# Patient Record
Sex: Male | Born: 1985 | Race: Black or African American | Hispanic: No | Marital: Married | Smoking: Never smoker
Health system: Southern US, Community
[De-identification: ages and names within clinical notes are randomized; demographics above are authoritative.]

## PROBLEM LIST (undated history)

## (undated) DIAGNOSIS — E669 Obesity, unspecified: Secondary | ICD-10-CM

## (undated) DIAGNOSIS — I10 Essential (primary) hypertension: Secondary | ICD-10-CM

---

## 2007-08-17 ENCOUNTER — Encounter: Admission: RE | Admit: 2007-08-17 | Discharge: 2007-08-17 | Payer: Self-pay | Admitting: Family Medicine

## 2008-08-01 ENCOUNTER — Emergency Department (HOSPITAL_COMMUNITY): Admission: EM | Admit: 2008-08-01 | Discharge: 2008-08-01 | Payer: Self-pay | Admitting: Family Medicine

## 2008-10-23 IMAGING — CR DG MANDIBLE 1-3V
4 series · 4 of 4 positions shown · non-contrast
Comparison: none

CLINICAL DATA: Football injury.  Left jaw pain.  Decreased range of motion TM joint.
 DIAGNOSTIC MANDIBLE - 4 VIEW:

[view not recorded (1 of 4)]
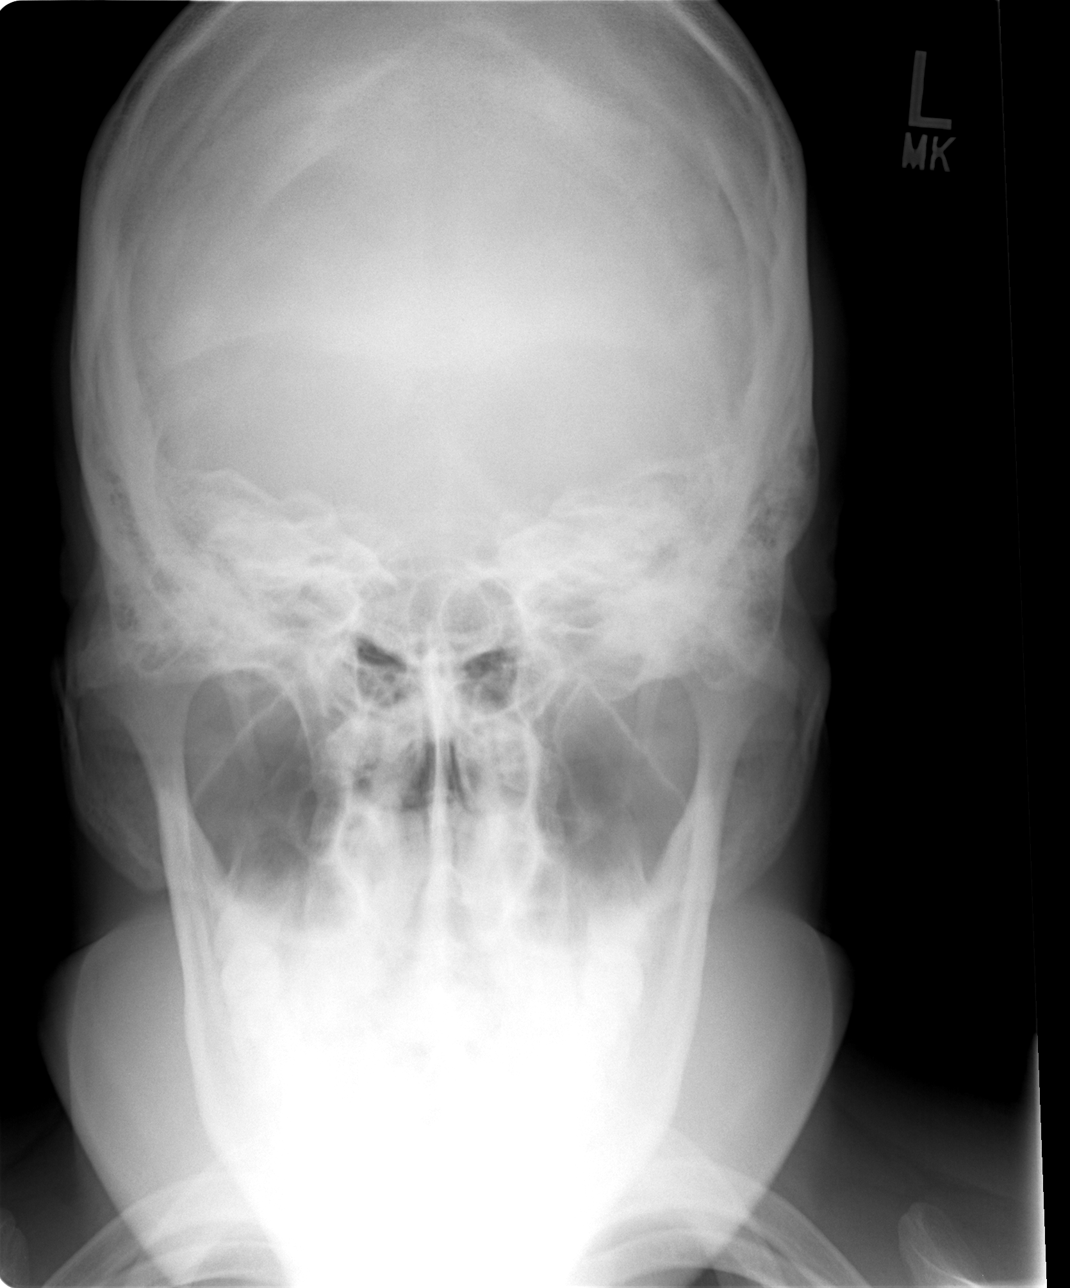

[view not recorded (2 of 4)]
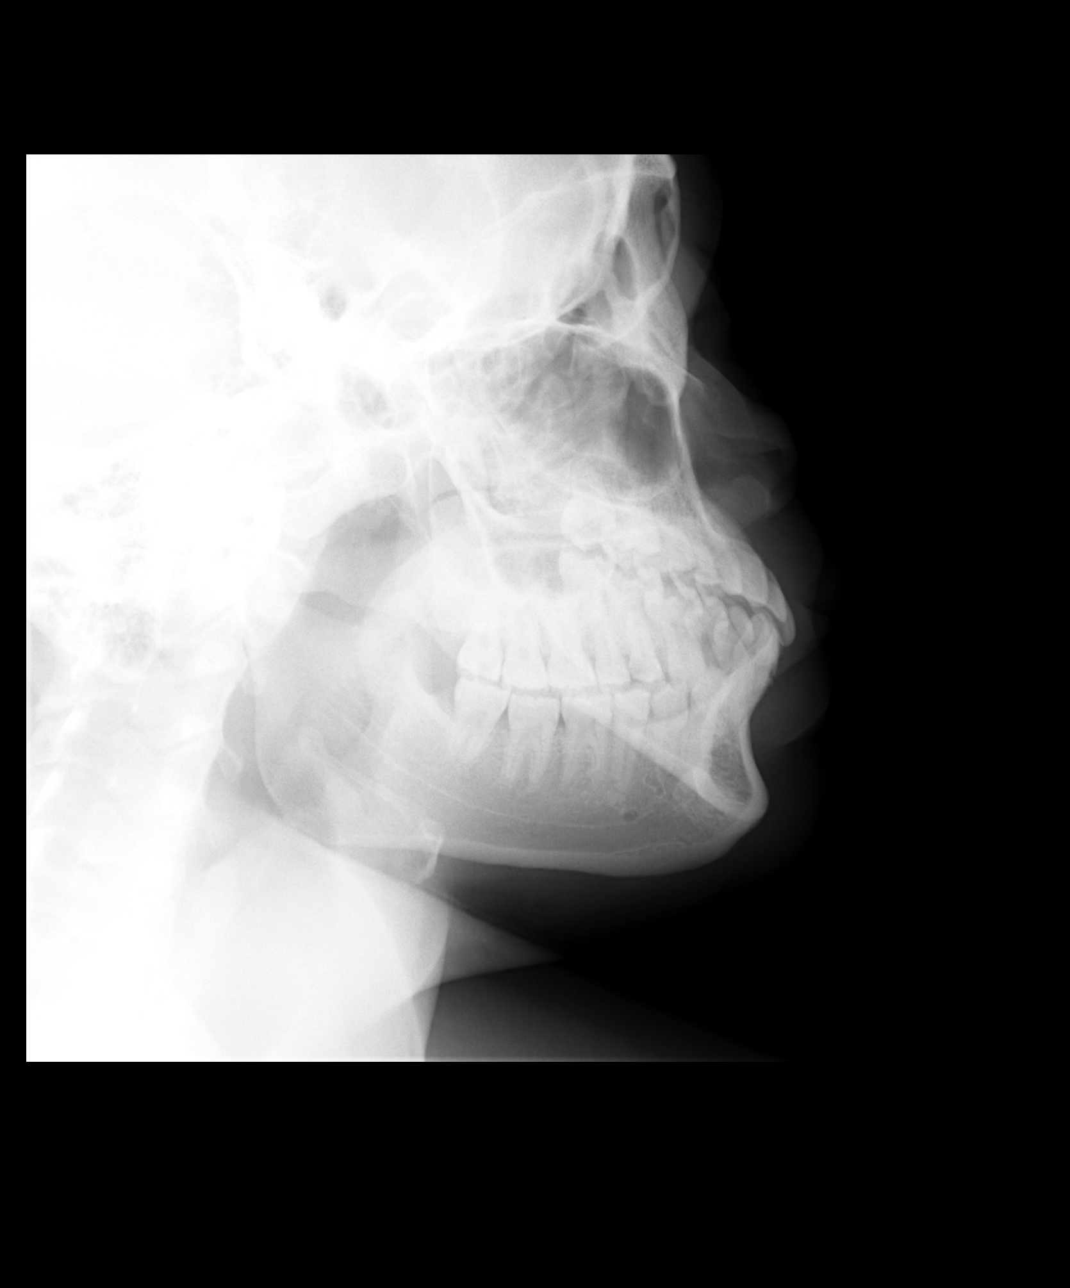

[view not recorded (3 of 4)]
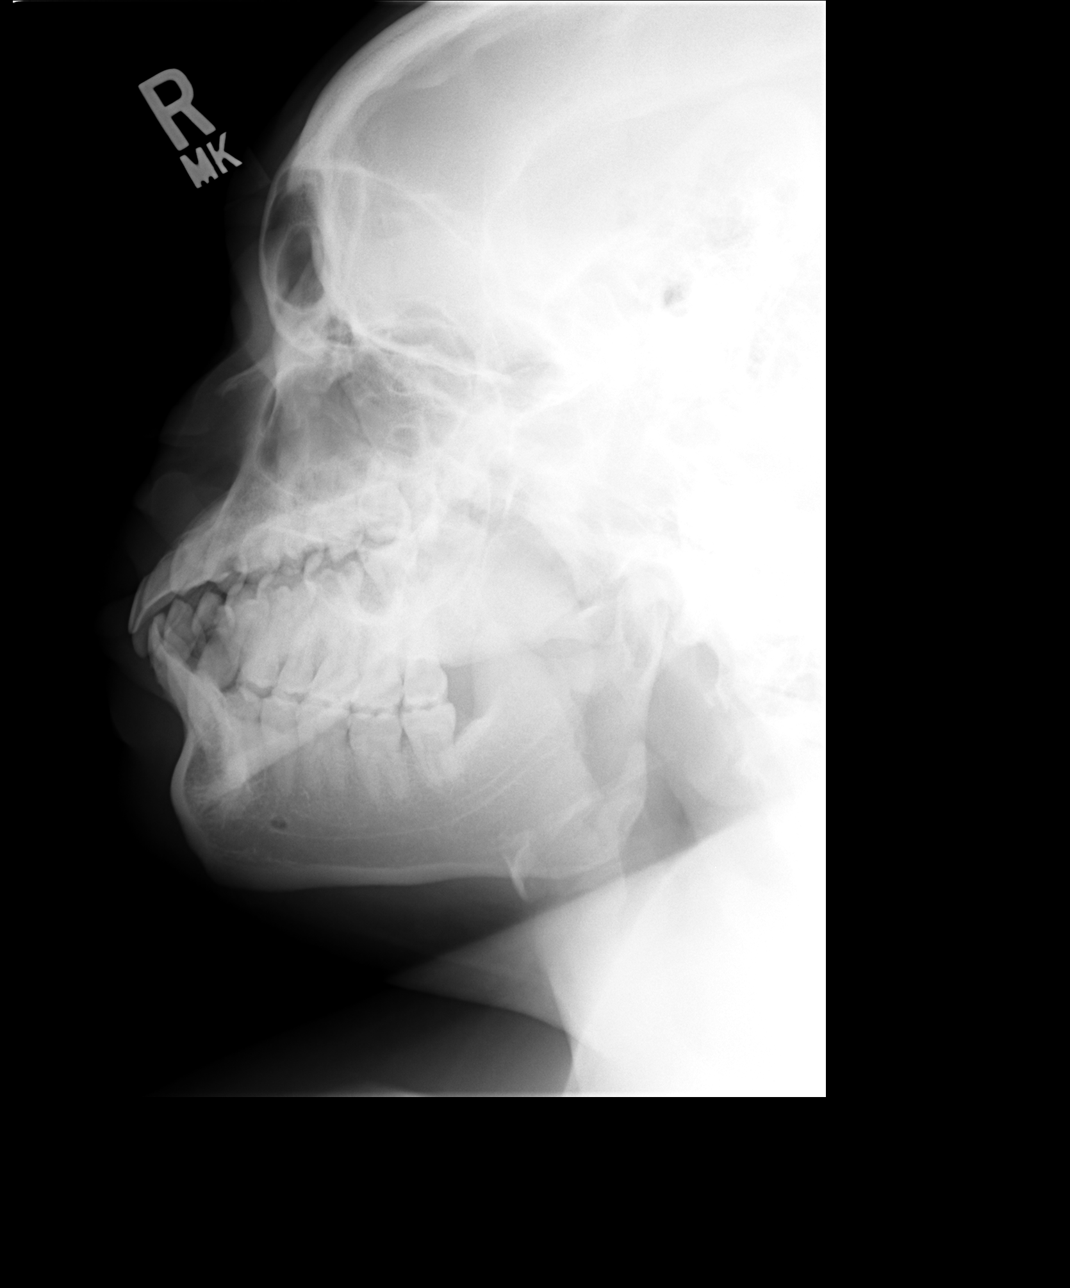

[view not recorded (4 of 4)]
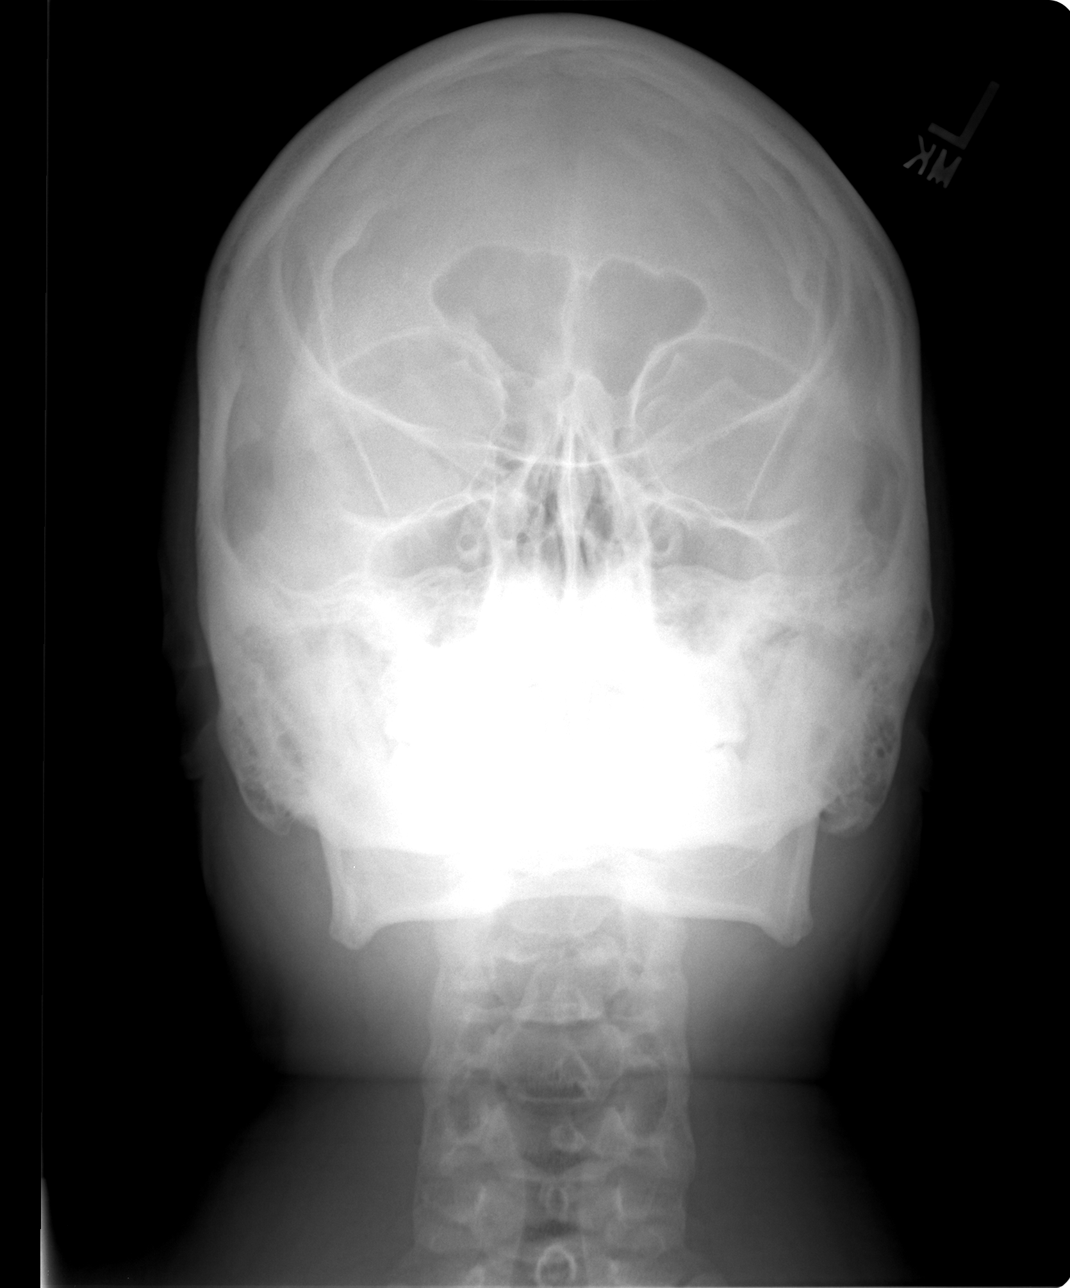

[4 of 4 positions shown; findings below may reference images not displayed]

FINDINGS: No fracture is seen.  Bilateral temporomandibular joints appear normally located.  Visualized paranasal sinuses and bilateral mastoid air cells are clear.
IMPRESSION: Negative.

## 2011-08-18 LAB — CBC
MCHC: 32.8
MCV: 79
RDW: 13.9

## 2011-08-18 LAB — URINALYSIS, ROUTINE W REFLEX MICROSCOPIC
Bilirubin Urine: NEGATIVE
Hgb urine dipstick: NEGATIVE
Nitrite: NEGATIVE
Protein, ur: NEGATIVE
Specific Gravity, Urine: 1.023
Urobilinogen, UA: 0.2

## 2011-08-18 LAB — DIFFERENTIAL
Basophils Absolute: 0
Basophils Relative: 0
Eosinophils Absolute: 0.1
Monocytes Absolute: 0.4
Neutro Abs: 7.9 — ABNORMAL HIGH
Neutrophils Relative %: 92 — ABNORMAL HIGH

## 2011-08-18 LAB — URINE MICROSCOPIC-ADD ON

## 2013-02-26 ENCOUNTER — Encounter (HOSPITAL_COMMUNITY): Payer: Self-pay | Admitting: *Deleted

## 2013-02-26 ENCOUNTER — Emergency Department (HOSPITAL_COMMUNITY)
Admission: EM | Admit: 2013-02-26 | Discharge: 2013-02-26 | Disposition: A | Discharge status: Home / Self Care | Payer: Self-pay | Attending: Emergency Medicine | Admitting: Emergency Medicine

## 2013-02-26 DIAGNOSIS — K5289 Other specified noninfective gastroenteritis and colitis: Secondary | ICD-10-CM | POA: Insufficient documentation

## 2013-02-26 DIAGNOSIS — K529 Noninfective gastroenteritis and colitis, unspecified: Secondary | ICD-10-CM

## 2013-02-26 DIAGNOSIS — R42 Dizziness and giddiness: Secondary | ICD-10-CM | POA: Insufficient documentation

## 2013-02-26 DIAGNOSIS — R112 Nausea with vomiting, unspecified: Secondary | ICD-10-CM | POA: Insufficient documentation

## 2013-02-26 DIAGNOSIS — E669 Obesity, unspecified: Secondary | ICD-10-CM | POA: Insufficient documentation

## 2013-02-26 DIAGNOSIS — R197 Diarrhea, unspecified: Secondary | ICD-10-CM | POA: Insufficient documentation

## 2013-02-26 HISTORY — DX: Obesity, unspecified: E66.9

## 2013-02-26 LAB — URINALYSIS, ROUTINE W REFLEX MICROSCOPIC
Glucose, UA: NEGATIVE mg/dL
Hgb urine dipstick: NEGATIVE
Protein, ur: NEGATIVE mg/dL
Specific Gravity, Urine: 1.023 (ref 1.005–1.030)
pH: 8.5 — ABNORMAL HIGH (ref 5.0–8.0)

## 2013-02-26 LAB — BASIC METABOLIC PANEL
CO2: 29 mEq/L (ref 19–32)
Calcium: 9.9 mg/dL (ref 8.4–10.5)
Chloride: 103 mEq/L (ref 96–112)
Creatinine, Ser: 0.97 mg/dL (ref 0.50–1.35)
Glucose, Bld: 118 mg/dL — ABNORMAL HIGH (ref 70–99)

## 2013-02-26 LAB — CBC WITH DIFFERENTIAL/PLATELET
Basophils Absolute: 0 10*3/uL (ref 0.0–0.1)
Eosinophils Relative: 0 % (ref 0–5)
HCT: 42.1 % (ref 39.0–52.0)
Hemoglobin: 14.8 g/dL (ref 13.0–17.0)
Lymphocytes Relative: 3 % — ABNORMAL LOW (ref 12–46)
Lymphs Abs: 0.3 10*3/uL — ABNORMAL LOW (ref 0.7–4.0)
MCV: 73.5 fL — ABNORMAL LOW (ref 78.0–100.0)
Monocytes Absolute: 0.4 10*3/uL (ref 0.1–1.0)
Monocytes Relative: 4 % (ref 3–12)
Neutro Abs: 9.2 10*3/uL — ABNORMAL HIGH (ref 1.7–7.7)
RBC: 5.73 MIL/uL (ref 4.22–5.81)
WBC: 9.9 10*3/uL (ref 4.0–10.5)

## 2013-02-26 LAB — LIPASE, BLOOD: Lipase: 17 U/L (ref 11–59)

## 2013-02-26 MED ORDER — PROMETHAZINE HCL 25 MG RE SUPP: 25.0000 mg | Freq: Four times a day (QID) | RECTAL | Status: DC | PRN

## 2013-02-26 MED ORDER — ACETAMINOPHEN 325 MG PO TABS
650.0000 mg | ORAL_TABLET | Freq: Once | ORAL | Status: AC
  Administered 2013-02-26: 650 mg via ORAL
  Filled 2013-02-26: qty 2

## 2013-02-26 MED ORDER — ACETAMINOPHEN 325 MG PO TABS: 650.0000 mg | ORAL_TABLET | Freq: Four times a day (QID) | ORAL | Status: DC | PRN

## 2013-02-26 MED ORDER — ONDANSETRON HCL 4 MG/2ML IJ SOLN
4.0000 mg | Freq: Once | INTRAMUSCULAR | Status: AC
  Administered 2013-02-26: 4 mg via INTRAVENOUS
  Filled 2013-02-26: qty 2

## 2013-02-26 MED ORDER — MORPHINE SULFATE 4 MG/ML IJ SOLN
4.0000 mg | Freq: Once | INTRAMUSCULAR | Status: AC
  Administered 2013-02-26: 4 mg via INTRAVENOUS
  Filled 2013-02-26: qty 1

## 2013-02-26 MED ORDER — ONDANSETRON 8 MG PO TBDP: 8.0000 mg | ORAL_TABLET | Freq: Three times a day (TID) | ORAL | Status: DC | PRN

## 2013-02-26 MED ORDER — SODIUM CHLORIDE 0.9 % IV BOLUS (SEPSIS)
1000.0000 mL | Freq: Once | INTRAVENOUS | Status: AC
  Administered 2013-02-26: 1000 mL via INTRAVENOUS

## 2013-02-26 NOTE — ED Notes (Signed)
Pt presents with left flank pain that began around 10am today, states pain has progressively gotten worse throughout the day.  Pt admits to N/V/D, last BM today.  Pt unsure when the last time he urinated.  Abdomen tender to palpation, bowel sounds present in all quadrants.

## 2013-02-26 NOTE — ED Notes (Signed)
Reports abd pain that started today 1000, having n/v and last bm was pta.

## 2013-02-26 NOTE — ED Provider Notes (Signed)
History     CSN: [id number]  Arrival date & time 02/26/13  1731   First MD Initiated Contact with Patient 02/26/13 1737      Chief Complaint  Patient presents with  . Abdominal Pain    (Consider location/radiation/quality/duration/timing/severity/associated sxs/prior treatment) HPI Comments: Pt w/ no medical or surgical hx comes in with cc of abd pain. Pt started having abd pain when he woke up this am. The pain is in the epigastrium and left sided and is intermittent pain. The pais is dull, and associated with nausea, emesis, diarrhea. 7-10 episodes of emesis and diarrhea. Non bloody, non bilious. Pt is feeling slightly dizzy and weak. Pt had some chinese food at 3 am.  Patient is a 27 y.o. male presenting with abdominal pain. The history is provided by the patient.  Abdominal Pain Associated symptoms: diarrhea, nausea and vomiting   Associated symptoms: no chest pain, no cough, no dysuria and no shortness of breath     Past Medical History  Diagnosis Date  . Obesity     History reviewed. No pertinent past surgical history.  History reviewed. No pertinent family history.  History  Substance Use Topics  . Smoking status: Not on file  . Smokeless tobacco: Not on file  . Alcohol Use: No      Review of Systems  Constitutional: Negative for activity change and appetite change.  Respiratory: Negative for cough and shortness of breath.   Cardiovascular: Negative for chest pain.  Gastrointestinal: Positive for nausea, vomiting, abdominal pain and diarrhea.  Genitourinary: Negative for dysuria.  Neurological: Positive for dizziness.    Allergies  Review of patient's allergies indicates no known allergies.  Home Medications  No current outpatient prescriptions on file.  BP 122/77  Pulse 110  Temp(Src) 100.6 F (38.1 C) (Oral)  Resp 18  SpO2 97%  Physical Exam  Nursing note and vitals reviewed. Constitutional: He is oriented to person, place, and time. He  appears well-developed.  HENT:  Head: Normocephalic and atraumatic.  Eyes: Conjunctivae and EOM are normal. Pupils are equal, round, and reactive to light.  Neck: Normal range of motion. Neck supple.  Cardiovascular: Normal rate and regular rhythm.   Pulmonary/Chest: Effort normal and breath sounds normal.  Abdominal: Soft. Bowel sounds are normal. He exhibits no distension. There is tenderness. There is no rebound and no guarding.  LUQ tenderness, no rebound, guarding, no CVA tenderness  Neurological: He is alert and oriented to person, place, and time.  Skin: Skin is warm.    ED Course  Procedures (including critical care time)  Labs Reviewed  URINALYSIS, ROUTINE W REFLEX MICROSCOPIC - Abnormal; Notable for the following:    pH 8.5 (*)    Leukocytes, UA TRACE (*)    All other components within normal limits  CBC WITH DIFFERENTIAL - Abnormal; Notable for the following:    MCV 73.5 (*)    MCH 25.8 (*)    Neutrophils Relative 93 (*)    Neutro Abs 9.2 (*)    Lymphocytes Relative 3 (*)    Lymphs Abs 0.3 (*)    All other components within normal limits  BASIC METABOLIC PANEL - Abnormal; Notable for the following:    Glucose, Bld 118 (*)    All other components within normal limits  LIPASE, BLOOD  URINE MICROSCOPIC-ADD ON   No results found.   No diagnosis found.    MDM  DDx includes: Pancreatitis Hepatobiliary pathology including cholecystitis Gastritis/PUD SBO ACS syndrome Aortic Dissection Nephrolithiasis  Gastroenteritis Food poisoning  Pt comes in with cc of abd pain. Pt has emesis, diarrhea. Our exam is negative for peritoneal findings. No surgical hx, no hx of renal stones and no uti like sx.  Appears to be viral gastro vs. Food poisoning. He is tachycardic, and appears dry, so we will hydrate and start fluid challenge.  7:50 PM Pt has a low grade fever. Still no concerns for bacterial infection. No travel hx, no one else in the [place] his having same  sx. Pt has passed the po challenge here. Will discharge.   Derwood Kaplan, MD 02/26/13 [phone]

## 2015-05-17 ENCOUNTER — Ambulatory Visit (INDEPENDENT_AMBULATORY_CARE_PROVIDER_SITE_OTHER): Payer: 59 | Admitting: Podiatry

## 2015-05-17 ENCOUNTER — Encounter: Payer: Self-pay | Admitting: Podiatry

## 2015-05-17 VITALS — BP 134/90 | HR 90 | Resp 16

## 2015-05-17 DIAGNOSIS — L600 Ingrowing nail: Secondary | ICD-10-CM

## 2015-05-17 MED ORDER — NEOMYCIN-POLYMYXIN-HC 1 % OT SOLN: OTIC | Status: DC

## 2015-05-17 MED ORDER — AMOXICILLIN-POT CLAVULANATE 875-125 MG PO TABS: 1.0000 | ORAL_TABLET | Freq: Two times a day (BID) | ORAL | Status: DC

## 2015-05-17 NOTE — Progress Notes (Signed)
   Subjective:    Patient ID: Shaun King, male    DOB: 06-May-1986, 29 y.o.   MRN: [id number]  HPI Comments: "I have pain in the big toe"  Patient c/o tender 1st toe left, both borders, for few months, off and on. The area is swollen, discolored and draining. Tried soaking epsom salt, vaseline.-not getting better.  Foot Pain      Review of Systems  Cardiovascular: Positive for leg swelling.  Skin: Positive for wound.  All other systems reviewed and are negative.      Objective:   Physical Exam: I have reviewed his past medical history medications allergies surgery social history and review of systems. Pulses are strongly palpable. Neurologic sensorium is intact per Semmes-Weinstein monofilament and deep tendon reflex flexes are intact bilateral and brisk bilaterally. Muscle strength +5 over 5 dorsiflexion plantar flexors and inverters emerged all into the musculature is intact. Orthopedic evaluation demonstrate all joints distal to the ankle before range of motion without crepitation. Cutaneous evaluation demonstrates sharp incurvated nail margins with gross granulation tissue and hygienic granulomas in the tibiofibular border of the hallux left.        Assessment & Plan:  Assessment: Ingrown nail paronychia abscess hallux left.  Plan: Discussed etiology pathology conservative versus surgical therapies. At this point an incision and drainage was performed to the tibiofibular borders of the hallux left. This was not corrected permanently. He tolerated this procedure well after local and aesthetic was administered. This did not probe to bone. All necrotic tissue was resected with margins of the nail. Betadine solution Xeroform gauze and a dry sterile compressive dressing was applied. He was given a prescription for Augmentin 875 one by mouth twice a day as well as instructions on how to care and sent for his toes. I will follow-up with him in 1 week.

## 2015-05-17 NOTE — Patient Instructions (Signed)

## 2021-02-07 ENCOUNTER — Telehealth: Payer: Self-pay | Admitting: Podiatry

## 2021-02-07 ENCOUNTER — Ambulatory Visit (INDEPENDENT_AMBULATORY_CARE_PROVIDER_SITE_OTHER): Payer: 59 | Admitting: Podiatry

## 2021-02-07 ENCOUNTER — Encounter: Payer: Self-pay | Admitting: Podiatry

## 2021-02-07 ENCOUNTER — Other Ambulatory Visit: Payer: Self-pay

## 2021-02-07 DIAGNOSIS — L600 Ingrowing nail: Secondary | ICD-10-CM

## 2021-02-07 NOTE — Telephone Encounter (Signed)
Patient called in from pharmacy stating the prescriptions hasn't been called in yet, Please Advise

## 2021-02-07 NOTE — Progress Notes (Signed)
Subjective:   Patient ID: Shaun King, male   DOB: 35 y.o.   MRN: [id number]   HPI Patient presents stating he has had a chronic ingrown toenail of his left big toe and has tried to trim it and is at regular soak it without relief and it is very sore and makes it hard to wear shoe gear.  Patient does not smoke likes to be active   Review of Systems  All other systems reviewed and are negative.       Objective:  Physical Exam Vitals and nursing note reviewed.  Constitutional:      Appearance: He is well-developed.  Pulmonary:     Effort: Pulmonary effort is normal.  Musculoskeletal:        General: Normal range of motion.  Skin:    General: Skin is warm.  Neurological:     Mental Status: He is alert.     Neurovascular status intact muscle strength adequate range of motion of upper radicular left hallux lateral border painful when pressed making shoe gear difficult.  No active redness no active drainage noted currently     Assessment:  Ingrown toenail deformity with pain left hallux lateral border with structural deformity     Plan:  H&P reviewed condition recommended correction of deformity.  Explained procedure risk and patient wants surgery and today I allowed him to sign consent form understanding risk.  I infiltrated the left hallux 60 mg like Marcaine mixture sterile prep done and using sterile instrumentation remove the lateral border exposed matrix applied phenol 3 applications 30 seconds followed by alcohol lavage sterile dressing gave instructions on soaks and encouraged him to leave dressing on 24 hours but take it off earlier if any throbbing were to recur

## 2021-02-07 NOTE — Patient Instructions (Signed)

## 2021-02-08 NOTE — Telephone Encounter (Signed)
Does not need meds for this. If in pain can take ibuprophen or tylenol

## 2021-08-28 ENCOUNTER — Ambulatory Visit
Admission: EM | Admit: 2021-08-28 | Discharge: 2021-08-28 | Disposition: A | Discharge status: Home / Self Care | Payer: 59 | Attending: Emergency Medicine | Admitting: Emergency Medicine

## 2021-08-28 DIAGNOSIS — M62838 Other muscle spasm: Secondary | ICD-10-CM | POA: Diagnosis not present

## 2021-08-28 HISTORY — DX: Essential (primary) hypertension: I10

## 2021-08-28 MED ORDER — CYCLOBENZAPRINE HCL 10 MG PO TABS: 10.0000 mg | ORAL_TABLET | Freq: Two times a day (BID) | ORAL | 0 refills | Status: DC | PRN

## 2021-08-28 MED ORDER — IBUPROFEN 600 MG PO TABS: 600.0000 mg | ORAL_TABLET | Freq: Four times a day (QID) | ORAL | 0 refills | Status: DC | PRN

## 2021-08-28 NOTE — ED Provider Notes (Signed)
Shaun King    CSN: [id number] Arrival date & time: 08/28/21  4098      History   Chief Complaint Chief Complaint  Patient presents with   Shoulder Pain    Right shoulder pain x 3 days     HPI Shaun King is a 35 y.o. male.  Patient presents with 4-day history of right neck pain due to muscle spasm.  His symptoms started after sleeping in an awkward angle.  Treatment attempted at home with Tylenol, ibuprofen, ice packs, Biofreeze, warm compresses.  He reports intermittent numbness and tingling in his right upper arm; none currently.  No falls or injury.  He denies weakness, redness, bruising, wounds, fever, chills, or other symptoms.  His medical history includes hypertension and obesity.  The history is provided by the patient and medical records.   Past Medical History:  Diagnosis Date   Hypertension    Obesity     There are no problems to display for this patient.   History reviewed. No pertinent surgical history.     Home Medications    Prior to Admission medications   Medication Sig Start Date End Date Taking? Authorizing Provider  cyclobenzaprine (FLEXERIL) 10 MG tablet Take 1 tablet (10 mg total) by mouth 2 (two) times daily as needed for muscle spasms. 08/28/21  Yes Mickie Bail, NP  ibuprofen (ADVIL) 600 MG tablet Take 1 tablet (600 mg total) by mouth every 6 (six) hours as needed. 08/28/21  Yes Mickie Bail, NP  metoprolol succinate (TOPROL-XL) 50 MG 24 hr tablet Take 50 mg by mouth daily. 02/05/21   [provider]    Family History History reviewed. No pertinent family history.  Social History Social History   Tobacco Use   Smoking status: Never   Smokeless tobacco: Never  Vaping Use   Vaping Use: Never used  Substance Use Topics   Alcohol use: Yes    Alcohol/week: 0.0 standard drinks   Drug use: No     Allergies   Patient has no known allergies.   Review of Systems Review of Systems  Constitutional:  Negative for  chills and fever.  Respiratory:  Negative for cough and shortness of breath.   Cardiovascular:  Negative for chest pain and palpitations.  Musculoskeletal:  Positive for myalgias and neck pain. Negative for arthralgias.  Skin:  Negative for color change, rash and wound.  Neurological:  Positive for numbness. Negative for dizziness, weakness and headaches.  All other systems reviewed and are negative.   Physical Exam Triage Vital Signs ED Triage Vitals [08/28/21 0953]  Enc Vitals Group     BP (!) 141/90     Pulse Rate 88     Resp 18     Temp 97.7 F (36.5 C)     Temp src      SpO2 96 %     Weight      Height      Head Circumference      Peak Flow      Pain Score      Pain Loc      Pain Edu?      Excl. in GC?    No data found.  Updated Vital Signs BP (!) 141/90 (BP Location: Left Arm)   Pulse 88   Temp 97.7 F (36.5 C)   Resp 18   SpO2 96%   Visual Acuity Right Eye Distance:   Left Eye Distance:   Bilateral Distance:  Right Eye Near:   Left Eye Near:    Bilateral Near:     Physical Exam Vitals and nursing note reviewed.  Constitutional:      General: He is not in acute distress.    Appearance: Normal appearance. He is well-developed.  HENT:     Head: Normocephalic and atraumatic.     Mouth/Throat:     Mouth: Mucous membranes are moist.  Eyes:     Conjunctiva/sclera: Conjunctivae normal.  Cardiovascular:     Rate and Rhythm: Normal rate and regular rhythm.     Heart sounds: Normal heart sounds.  Pulmonary:     Effort: Pulmonary effort is normal. No respiratory distress.     Breath sounds: Normal breath sounds.  Abdominal:     Palpations: Abdomen is soft.     Tenderness: There is no abdominal tenderness.  Musculoskeletal:        General: Tenderness present. No swelling, deformity or signs of injury.     Cervical back: Neck supple.     Comments: Tenderness to palpation of right trapezius.  Neck ROM limited due to discomfort and muscle spasm.   Skin:    General: Skin is warm and dry.     Capillary Refill: Capillary refill takes less than 2 seconds.     Findings: No bruising, erythema, lesion or rash.  Neurological:     General: No focal deficit present.     Mental Status: He is alert and oriented to person, place, and time.     Sensory: No sensory deficit.     Motor: No weakness.     Gait: Gait normal.  Psychiatric:        Mood and Affect: Mood normal.        Behavior: Behavior normal.     UC Treatments / Results  Labs (all labs ordered are listed, but only abnormal results are displayed) Labs Reviewed - No data to display  EKG   Radiology No results found.  Procedures Procedures (including critical care time)  Medications Ordered in UC Medications - No data to display  Initial Impression / Assessment and Plan / UC Course  I have reviewed the triage vital signs and the nursing notes.  Pertinent labs & imaging results that were available during my care of the patient were reviewed by me and considered in my medical decision making (see chart for details).  Muscle spasm of neck.  Treating with ibuprofen and Flexeril.  Precautions for drowsiness with Flexeril discussed.  Instructed patient to follow-up with his PCP or an orthopedist if his symptoms are not improving.  He agrees to plan of care.   Final Clinical Impressions(s) / UC Diagnoses   Final diagnoses:  Muscle spasms of neck     Discharge Instructions      Take ibuprofen as needed for discomfort.  Take the muscle relaxer as needed for muscle spasm; Do not drive, operate machinery, or drink alcohol with this medication as it can cause drowsiness.   Follow up with your primary care provider or an orthopedist if your symptoms are not improving.         ED Prescriptions     Medication Sig Dispense Auth. Provider   ibuprofen (ADVIL) 600 MG tablet Take 1 tablet (600 mg total) by mouth every 6 (six) hours as needed. 30 tablet Mickie Bail, NP    cyclobenzaprine (FLEXERIL) 10 MG tablet Take 1 tablet (10 mg total) by mouth 2 (two) times daily as needed for muscle spasms.  20 tablet Mickie Bail, NP      PDMP not reviewed this encounter.   Mickie Bail, NP 08/28/21 1027

## 2021-08-28 NOTE — Discharge Instructions (Addendum)
Take ibuprofen as needed for discomfort.  Take the muscle relaxer as needed for muscle spasm; Do not drive, operate machinery, or drink alcohol with this medication as it can cause drowsiness.   Follow up with your primary care provider or an orthopedist if your symptoms are not improving.     

## 2021-08-28 NOTE — ED Triage Notes (Signed)
Patient presents to Urgent Care with complaints of right shoulder pain x 4 days. Headache and neck pain since today. He states pain initiated after sleeping in an awkward angle 4 days ago. He states pain increases with movement. Intermittent numbness and tingling sensation.He was seen by chiropractor yesterday noted some improvement. Chiropractor instructed patient to apply ice, heat, and provided instruction on exercises.  Treating pain with ibuprofen.

## 2021-09-16 ENCOUNTER — Ambulatory Visit
Admission: EM | Admit: 2021-09-16 | Discharge: 2021-09-16 | Disposition: A | Discharge status: Home / Self Care | Payer: 59 | Attending: Emergency Medicine | Admitting: Emergency Medicine

## 2021-09-16 ENCOUNTER — Other Ambulatory Visit: Payer: Self-pay

## 2021-09-16 DIAGNOSIS — J069 Acute upper respiratory infection, unspecified: Secondary | ICD-10-CM | POA: Diagnosis not present

## 2021-09-16 DIAGNOSIS — H109 Unspecified conjunctivitis: Secondary | ICD-10-CM

## 2021-09-16 MED ORDER — POLYMYXIN B-TRIMETHOPRIM 10000-0.1 UNIT/ML-% OP SOLN: 1.0000 [drp] | Freq: Four times a day (QID) | OPHTHALMIC | 0 refills | Status: DC

## 2021-09-16 MED ORDER — POLYMYXIN B-TRIMETHOPRIM 10000-0.1 UNIT/ML-% OP SOLN: 1.0000 [drp] | Freq: Four times a day (QID) | OPHTHALMIC | 0 refills | Status: AC

## 2021-09-16 NOTE — ED Triage Notes (Signed)
Patient presents to Urgent Care with multiple complaints of cough x 1 week and eye irritation x 1 day. He got a particle in his eye, woke up today with eye drainage. He reports flushing eyes out with no improvement. Treating cough with Mucinex, Nyquil, Theraflu, and tylenol.   Denies fever or SOB.

## 2021-09-16 NOTE — ED Provider Notes (Signed)
Shaun King    CSN: [id number] Arrival date & time: 09/16/21  1745      History   Chief Complaint Chief Complaint  Patient presents with   Cough   Eye Problem    HPI Shaun King is a 35 y.o. male.  Patient presents with 1 week history of sore throat and cough.  Treatment at home with several OTC cold medications.  He also reports 1 day history of bilateral eye irritation and drainage.  He thinks he got a drying powder in his eyes yesterday at work.  Treatment at that time with flushing at eyewash station.  He states his eyes have been red today with scant drainage.  No changes in vision or acute eye pain.  Patient denies fever, rash, shortness of breath, vomiting, diarrhea, or other symptoms.  His medical history includes hypertension and obesity.  The history is provided by the patient and medical records.   Past Medical History:  Diagnosis Date   Hypertension    Obesity     There are no problems to display for this patient.   History reviewed. No pertinent surgical history.     Home Medications    Prior to Admission medications   Medication Sig Start Date End Date Taking? Authorizing Provider  cyclobenzaprine (FLEXERIL) 10 MG tablet Take 1 tablet (10 mg total) by mouth 2 (two) times daily as needed for muscle spasms. 08/28/21   Mickie Bail, NP  ibuprofen (ADVIL) 600 MG tablet Take 1 tablet (600 mg total) by mouth every 6 (six) hours as needed. 08/28/21   Mickie Bail, NP  metoprolol succinate (TOPROL-XL) 50 MG 24 hr tablet Take 50 mg by mouth daily. 02/05/21   [provider]  trimethoprim-polymyxin b (POLYTRIM) ophthalmic solution Place 1 drop into both eyes 4 (four) times daily for 7 days. 09/16/21 09/23/21  Mickie Bail, NP    Family History History reviewed. No pertinent family history.  Social History Social History   Tobacco Use   Smoking status: Never   Smokeless tobacco: Never  Vaping Use   Vaping Use: Never used  Substance Use  Topics   Alcohol use: Yes    Alcohol/week: 0.0 standard drinks   Drug use: No     Allergies   Patient has no known allergies.   Review of Systems Review of Systems  Constitutional:  Negative for chills and fever.  HENT:  Positive for sore throat. Negative for ear pain.   Eyes:  Positive for discharge and redness. Negative for pain and visual disturbance.  Respiratory:  Positive for cough. Negative for shortness of breath.   Cardiovascular:  Negative for chest pain and palpitations.  Gastrointestinal:  Negative for abdominal pain, diarrhea and vomiting.  Skin:  Negative for color change and rash.  All other systems reviewed and are negative.   Physical Exam Triage Vital Signs ED Triage Vitals  Enc Vitals Group     BP      Pulse      Resp      Temp      Temp src      SpO2      Weight      Height      Head Circumference      Peak Flow      Pain Score      Pain Loc      Pain Edu?      Excl. in GC?    No data found.  Updated  Vital Signs BP 140/85 (BP Location: Left Arm)   Pulse 98   Temp 98.9 F (37.2 C) (Oral)   Resp 18   SpO2 96%   Visual Acuity Right Eye Distance: 20/20 Left Eye Distance: 20/20 Bilateral Distance: 20/16  Right Eye Near:   Left Eye Near:    Bilateral Near:     Physical Exam Vitals and nursing note reviewed.  Constitutional:      General: He is not in acute distress.    Appearance: He is well-developed. He is obese. He is not ill-appearing.  HENT:     Head: Normocephalic and atraumatic.     Right Ear: Tympanic membrane normal.     Left Ear: Tympanic membrane normal.     Nose: Nose normal.     Mouth/Throat:     Mouth: Mucous membranes are moist.     Pharynx: Oropharynx is clear.  Eyes:     General: Lids are normal. Vision grossly intact.     Extraocular Movements: Extraocular movements intact.     Conjunctiva/sclera:     Right eye: Right conjunctiva is injected.     Left eye: Left conjunctiva is injected.     Pupils: Pupils  are equal, round, and reactive to light.  Cardiovascular:     Rate and Rhythm: Normal rate and regular rhythm.     Heart sounds: Normal heart sounds.  Pulmonary:     Effort: Pulmonary effort is normal. No respiratory distress.     Breath sounds: Normal breath sounds.  Abdominal:     Palpations: Abdomen is soft.     Tenderness: There is no abdominal tenderness.  Musculoskeletal:     Cervical back: Neck supple.  Skin:    General: Skin is warm and dry.  Neurological:     General: No focal deficit present.     Mental Status: He is alert and oriented to person, place, and time.     Gait: Gait normal.  Psychiatric:        Mood and Affect: Mood normal.        Behavior: Behavior normal.     UC Treatments / Results  Labs (all labs ordered are listed, but only abnormal results are displayed) Labs Reviewed  COVID-19, FLU A+B NAA    EKG   Radiology No results found.  Procedures Procedures (including critical care time)  Medications Ordered in UC Medications - No data to display  Initial Impression / Assessment and Plan / UC Course  I have reviewed the triage vital signs and the nursing notes.  Pertinent labs & imaging results that were available during my care of the patient were reviewed by me and considered in my medical decision making (see chart for details).  Viral URI.  COVID and flu pending.  Discussed symptomatic treatment.  Instructed patient to rest and stay hydrated.  Instructed him to follow-up with his PCP if his symptoms are not improving. Bilateral conjunctivitis.  Patient thinks he may have had a chemical exposure of his eyes at work yesterday.  He thinks he may have touched a drying powder and then rubbed his eyes.  He flushed his eyes out at an eyewash station at work at the time.  He denies acute eye pain or changes in his vision.  Treating with Polytrim eyedrops.  Instructed him to follow-up with his eye care provider tomorrow.  ED precautions discussed.   Patient agrees to plan of care.   Final Clinical Impressions(s) / UC Diagnoses   Final diagnoses:  Viral URI  Conjunctivitis of both eyes, unspecified conjunctivitis type     Discharge Instructions      Use the antibiotic eyedrops as prescribed.  Follow-up with your eye care provider tomorrow.  Go to the emergency department if you have acute eye pain, changes in your vision, or other concerning symptoms.    Your COVID and Flu pending.  Take Tylenol or ibuprofen as needed for fever or discomfort.  Rest and keep yourself hydrated.  Follow-up with your primary care provider if your symptoms are not improving.    Only take cold medications that do not raise your blood pressure, such as Coricidin HBP or plain Mucinex.      ED Prescriptions     Medication Sig Dispense Auth. Provider   trimethoprim-polymyxin b (POLYTRIM) ophthalmic solution  (Status: Discontinued) Place 1 drop into both eyes 4 (four) times daily for 7 days. 10 mL Sharion Balloon, NP   trimethoprim-polymyxin b (POLYTRIM) ophthalmic solution Place 1 drop into both eyes 4 (four) times daily for 7 days. 10 mL Sharion Balloon, NP      PDMP not reviewed this encounter.   Sharion Balloon, NP 09/16/21 [phone]

## 2021-09-16 NOTE — Discharge Instructions (Addendum)
Use the antibiotic eyedrops as prescribed.  Follow-up with your eye care provider tomorrow.  Go to the emergency department if you have acute eye pain, changes in your vision, or other concerning symptoms.    Your COVID and Flu pending.  Take Tylenol or ibuprofen as needed for fever or discomfort.  Rest and keep yourself hydrated.  Follow-up with your primary care provider if your symptoms are not improving.    Only take cold medications that do not raise your blood pressure, such as Coricidin HBP or plain Mucinex.

## 2021-09-19 LAB — COVID-19, FLU A+B NAA
Influenza A, NAA: NOT DETECTED
Influenza B, NAA: NOT DETECTED
SARS-CoV-2, NAA: NOT DETECTED

## 2024-09-01 ENCOUNTER — Other Ambulatory Visit: Payer: Self-pay

## 2024-09-01 ENCOUNTER — Ambulatory Visit
Admission: EM | Admit: 2024-09-01 | Discharge: 2024-09-01 | Disposition: A | Discharge status: Home / Self Care | Attending: Family Medicine | Admitting: Family Medicine

## 2024-09-01 DIAGNOSIS — J111 Influenza due to unidentified influenza virus with other respiratory manifestations: Secondary | ICD-10-CM | POA: Diagnosis not present

## 2024-09-01 LAB — POC SOFIA SARS ANTIGEN FIA: SARS Coronavirus 2 Ag: NEGATIVE

## 2024-09-01 LAB — POCT INFLUENZA A/B
Influenza A, POC: NEGATIVE
Influenza B, POC: NEGATIVE

## 2024-09-01 MED ORDER — PREDNISONE 50 MG PO TABS: ORAL_TABLET | ORAL | 0 refills | Status: AC

## 2024-09-01 NOTE — Discharge Instructions (Signed)
 Home to rest With high blood pressure it is safe to take Mucinex DM or Coricidin HBP to help with your symptoms Take the prednisone once a day for 5 days May take ibuprofen  or Tylenol  for pain and fever Make sure you are drinking lots of fluids See your doctor if not improving by next week

## 2024-09-01 NOTE — ED Triage Notes (Signed)
 Sick since Monday, had runny nose. Now has cough prod of mucus, ha, neck stiffness, body aches, fatigue, sob, light sensitivity. Has not checked temperature, has had sweats the past 2 nights. Last night maybe had chills. Has been taking nyquil, tylenol , allergy medicine, decorel forte. Is hypertensive but reports his bp has been under control.

## 2024-09-01 NOTE — ED Provider Notes (Signed)
 [facility]    CSN: [id number] Arrival date & time: 09/01/24  1642      History   Chief Complaint Chief Complaint  Patient presents with   Cough    HPI Shaun King is a 38 y.o. male.   HPI  Pleasant gentleman who is here with an upper respiratory infection.  He has cough and congestion, sore throat and postnasal drip.  Headache and fatigue.  He thinks he may have had fever.  He has bodyaches.  He tried to work today but had to leave early.  He has had symptoms for a couple of days. No underlying asthma or emphysema He has hypertension which is usually well-controlled, but currently does not feel well.  Has a bad headache.  Has been taking over-the-counter cold medicines.   Past Medical History:  Diagnosis Date   Hypertension    Obesity     There are no active problems to display for this patient.   History reviewed. No pertinent surgical history.     Home Medications    Prior to Admission medications   Medication Sig Start Date End Date Taking? Authorizing Provider  predniSONE (DELTASONE) 50 MG tablet Take once a day for 5 days.  Take with food 09/01/24  Yes Maranda Jamee Jacob, MD  metoprolol succinate (TOPROL-XL) 50 MG 24 hr tablet Take 50 mg by mouth daily. 02/05/21   [provider]    Family History History reviewed. No pertinent family history.  Social History Social History   Tobacco Use   Smoking status: Never   Smokeless tobacco: Never  Vaping Use   Vaping status: Never Used  Substance Use Topics   Alcohol use: Not Currently   Drug use: No     Allergies   Patient has no known allergies.   Review of Systems Review of Systems See HPI  Physical Exam Triage Vital Signs ED Triage Vitals  Encounter Vitals Group     BP 09/01/24 1649 (!) 165/113     Girls Systolic BP Percentile --      Girls Diastolic BP Percentile --      Boys Systolic BP Percentile --      Boys Diastolic BP Percentile --      Pulse Rate 09/01/24  1649 (!) 101     Resp 09/01/24 1649 (!) 24     Temp 09/01/24 1649 99.5 F (37.5 C)     Temp src --      SpO2 09/01/24 1649 97 %     Weight --      Height --      Head Circumference --      Peak Flow --      Pain Score 09/01/24 1654 9     Pain Loc --      Pain Education --      Exclude from Growth Chart --    No data found.  Updated Vital Signs BP (!) 165/113   Pulse (!) 101   Temp 99.5 F (37.5 C)   Resp (!) 24   SpO2 97%   Repeat blood pressure by me 150/105    Physical Exam Constitutional:      General: He is not in acute distress.    Appearance: He is well-developed. He is obese. He is ill-appearing.  HENT:     Head: Normocephalic and atraumatic.     Right Ear: Tympanic membrane normal.     Left Ear: Tympanic membrane normal.     Nose: Nose  normal. No congestion.     Mouth/Throat:     Pharynx: Posterior oropharyngeal erythema present.  Eyes:     Conjunctiva/sclera: Conjunctivae normal.     Pupils: Pupils are equal, round, and reactive to light.  Cardiovascular:     Rate and Rhythm: Normal rate and regular rhythm.     Heart sounds: Normal heart sounds.  Pulmonary:     Effort: Pulmonary effort is normal. No respiratory distress.     Breath sounds: Wheezing and rhonchi present.  Abdominal:     General: There is no distension.     Palpations: Abdomen is soft.  Musculoskeletal:        General: Normal range of motion.     Cervical back: Normal range of motion.  Lymphadenopathy:     Cervical: No cervical adenopathy.  Skin:    General: Skin is warm and dry.  Neurological:     Mental Status: He is alert.      UC Treatments / Results  Labs (all labs ordered are listed, but only abnormal results are displayed) Labs Reviewed  POCT INFLUENZA A/B  POC SOFIA SARS ANTIGEN FIA    EKG   Radiology No results found.  Procedures Procedures (including critical care time)  Medications Ordered in UC Medications - No data to display  Initial Impression /  Assessment and Plan / UC Course  I have reviewed the triage vital signs and the nursing notes.  Pertinent labs & imaging results that were available during my care of the patient were reviewed by me and considered in my medical decision making (see chart for details).     Viral testing for COVID and flu are negative Discussed flulike illness, antibiotics will not be helpful And give prednisone to help with the cough, wheezing Final Clinical Impressions(s) / UC Diagnoses   Final diagnoses:  Influenza-like illness     Discharge Instructions      Home to rest With high blood pressure it is safe to take Mucinex DM or Coricidin HBP to help with your symptoms Take the prednisone once a day for 5 days May take ibuprofen  or Tylenol  for pain and fever Make sure you are drinking lots of fluids See your doctor if not improving by next week     ED Prescriptions     Medication Sig Dispense Auth. Provider   predniSONE (DELTASONE) 50 MG tablet Take once a day for 5 days.  Take with food 5 tablet Maranda Jamee Jacob, MD      PDMP not reviewed this encounter.   Maranda Jamee Jacob, MD 09/01/24 1754
